# Patient Record
Sex: Male | Born: 1985 | Race: White | Hispanic: No | Marital: Married | Smoking: Never smoker
Health system: Southern US, Community
[De-identification: ages and names within clinical notes are randomized; demographics above are authoritative.]

---

## 2015-08-23 ENCOUNTER — Encounter (HOSPITAL_BASED_OUTPATIENT_CLINIC_OR_DEPARTMENT_OTHER): Payer: Self-pay | Admitting: *Deleted

## 2015-08-23 ENCOUNTER — Emergency Department (HOSPITAL_BASED_OUTPATIENT_CLINIC_OR_DEPARTMENT_OTHER): Payer: Self-pay

## 2015-08-23 ENCOUNTER — Emergency Department (HOSPITAL_BASED_OUTPATIENT_CLINIC_OR_DEPARTMENT_OTHER)
Admission: EM | Admit: 2015-08-23 | Discharge: 2015-08-23 | Disposition: A | Discharge status: Home / Self Care | Payer: Self-pay | Attending: Emergency Medicine | Admitting: Emergency Medicine

## 2015-08-23 DIAGNOSIS — R0981 Nasal congestion: Secondary | ICD-10-CM | POA: Insufficient documentation

## 2015-08-23 DIAGNOSIS — E669 Obesity, unspecified: Secondary | ICD-10-CM | POA: Insufficient documentation

## 2015-08-23 DIAGNOSIS — R1013 Epigastric pain: Secondary | ICD-10-CM | POA: Insufficient documentation

## 2015-08-23 DIAGNOSIS — R0982 Postnasal drip: Secondary | ICD-10-CM | POA: Insufficient documentation

## 2015-08-23 LAB — CBC WITH DIFFERENTIAL/PLATELET
BASOS PCT: 0 %
Basophils Absolute: 0 10*3/uL (ref 0.0–0.1)
Eosinophils Absolute: 0.8 10*3/uL — ABNORMAL HIGH (ref 0.0–0.7)
Eosinophils Relative: 8 %
HEMATOCRIT: 40.3 % (ref 39.0–52.0)
HEMOGLOBIN: 13.4 g/dL (ref 13.0–17.0)
LYMPHS ABS: 2.8 10*3/uL (ref 0.7–4.0)
LYMPHS PCT: 28 %
MCH: 27 pg (ref 26.0–34.0)
MCHC: 33.3 g/dL (ref 30.0–36.0)
MCV: 81.3 fL (ref 78.0–100.0)
MONO ABS: 1.1 10*3/uL — AB (ref 0.1–1.0)
MONOS PCT: 11 %
NEUTROS ABS: 5.3 10*3/uL (ref 1.7–7.7)
NEUTROS PCT: 53 %
Platelets: 332 10*3/uL (ref 150–400)
RBC: 4.96 MIL/uL (ref 4.22–5.81)
RDW: 14.8 % (ref 11.5–15.5)
WBC: 9.9 10*3/uL (ref 4.0–10.5)

## 2015-08-23 LAB — COMPREHENSIVE METABOLIC PANEL
ALBUMIN: 3.7 g/dL (ref 3.5–5.0)
ALK PHOS: 63 U/L (ref 38–126)
ALT: 91 U/L — ABNORMAL HIGH (ref 17–63)
ANION GAP: 5 (ref 5–15)
AST: 60 U/L — ABNORMAL HIGH (ref 15–41)
BILIRUBIN TOTAL: 0.4 mg/dL (ref 0.3–1.2)
BUN: 12 mg/dL (ref 6–20)
CALCIUM: 8.7 mg/dL — AB (ref 8.9–10.3)
CHLORIDE: 108 mmol/L (ref 101–111)
CO2: 25 mmol/L (ref 22–32)
Creatinine, Ser: 0.93 mg/dL (ref 0.61–1.24)
GFR calc Af Amer: >60 mL/min (ref >60)
GLUCOSE: 107 mg/dL — AB (ref 65–99)
Potassium: 3.7 mmol/L (ref 3.5–5.1)
Sodium: 138 mmol/L (ref 135–145)
Total Protein: 7.1 g/dL (ref 6.5–8.1)

## 2015-08-23 LAB — OCCULT BLOOD X 1 CARD TO LAB, STOOL: Fecal Occult Bld: NEGATIVE

## 2015-08-23 LAB — LIPASE, BLOOD: Lipase: 25 U/L (ref 11–51)

## 2015-08-23 MED ORDER — ONDANSETRON HCL 4 MG/2ML IJ SOLN
4.0000 mg | Freq: Once | INTRAMUSCULAR | Status: AC
  Administered 2015-08-23: 4 mg via INTRAVENOUS
  Filled 2015-08-23: qty 2

## 2015-08-23 MED ORDER — PANTOPRAZOLE SODIUM 20 MG PO TBEC: 20.0000 mg | DELAYED_RELEASE_TABLET | Freq: Two times a day (BID) | ORAL | Status: AC

## 2015-08-23 MED ORDER — HYDROMORPHONE HCL 1 MG/ML IJ SOLN
0.5000 mg | Freq: Once | INTRAMUSCULAR | Status: AC
Start: 2015-08-23 — End: 2015-08-23
  Administered 2015-08-23: 0.5 mg via INTRAVENOUS
  Filled 2015-08-23: qty 1

## 2015-08-23 NOTE — ED Notes (Signed)
Abdominal pain in his upper abdomen x 2 days. He has a feeling like he has eaten to much. Pain is constant.

## 2015-08-23 NOTE — Discharge Instructions (Signed)

## 2015-08-23 NOTE — ED Provider Notes (Addendum)
CSN: [id number]     Arrival date & time 08/23/15  1430 History  By signing my name below, I, Timothy Sharp, attest that this documentation has been prepared under the direction and in the presence of Timothy Grizzle, MD. Electronically Signed: Budd Sharp, ED Scribe. 08/23/2015. 4:40 PM.      Chief Complaint  Patient presents with  . Abdominal Pain   The history is provided by the patient. No language interpreter was used.   HPI Comments: Timothy Sharp is a 30 y.o. male who presents to the Emergency Department complaining of constant, aching, upper abdominal pain onset 2 days ago. Pt states "it feels as though I have eaten too much and it never goes away." He reports associated mild diarrhea, sinus drainage, and mild congestion. He notes exacerbation of the pain with movement and after eating. He states that after eating he feels increasingly flatulent and gassy, neither of which alleviated the pain. He notes the pain alleviated to a 1/10 while he was waiting in the lobby of the ED. He reports his grandfather is adopted and thus he only has a skewed FHx. To his knowledge, he does not have a FHx of cholelithiasis. He denies use of alcohol or tobacco products. Pt denies loss of appetite, fever, chills, and n/v.   History reviewed. No pertinent past medical history. History reviewed. No pertinent past surgical history. No family history on file. Social History  Substance Use Topics  . Smoking status: Never Smoker   . Smokeless tobacco: None  . Alcohol Use: No    Review of Systems  Constitutional: Negative for fever and chills.  HENT: Positive for congestion and postnasal drip.   Gastrointestinal: Positive for diarrhea. Negative for nausea and vomiting.  All other systems reviewed and are negative.   Allergies  Review of patient's allergies indicates no known allergies.  Home Medications   Prior to Admission medications   Not on File   BP 119/67 mmHg  Pulse 77  Temp(Src) 98.6  F (37 C) (Oral)  Resp 18  SpO2 95% Physical Exam  Constitutional: He is oriented to person, place, and time. He appears well-developed and well-nourished.  obese  HENT:  Head: Normocephalic and atraumatic.  Right Ear: External ear normal.  Left Ear: External ear normal.  Nose: Nose normal.  Mouth/Throat: Oropharynx is clear and moist.  Eyes: Conjunctivae and EOM are normal. Pupils are equal, round, and reactive to light.  Neck: Normal range of motion. Neck supple.  Cardiovascular: Normal rate, regular rhythm, normal heart sounds and intact distal pulses.   Pulmonary/Chest: Effort normal and breath sounds normal. No respiratory distress. He has no wheezes. He exhibits no tenderness.  Abdominal: Soft. Bowel sounds are normal. He exhibits no distension and no mass. There is tenderness. There is no guarding.    Mild ttp  Musculoskeletal: Normal range of motion.  Neurological: He is alert and oriented to person, place, and time. He has normal reflexes. He exhibits normal muscle tone. Coordination normal.  Skin: Skin is warm and dry.  Psychiatric: He has a normal mood and affect. His behavior is normal. Judgment and thought content normal.  Nursing note and vitals reviewed.   ED Course  Procedures  DIAGNOSTIC STUDIES: Oxygen Saturation is 100% on RA, normal by my interpretation.    COORDINATION OF CARE: 4:37 PM - Discussed possible gastritis. Discussed plans to wait on diagnostic studies and imaging. Pt advised of plan for treatment and pt agrees. Labs Review Labs Reviewed  CBC WITH  DIFFERENTIAL/PLATELET - Abnormal; Notable for the following:    Monocytes Absolute 1.1 (*)    Eosinophils Absolute 0.8 (*)    All other components within normal limits  COMPREHENSIVE METABOLIC PANEL - Abnormal; Notable for the following:    Glucose, Bld 107 (*)    Calcium 8.7 (*)    AST 60 (*)    ALT 91 (*)    All other components within normal limits  LIPASE, BLOOD  OCCULT BLOOD X 1 CARD TO  LAB, STOOL    Imaging Review [place] Abdomen Complete  08/23/2015  CLINICAL DATA:  Upper abdominal pain for 2 days. Abdominal fullness. EXAM: ABDOMEN ULTRASOUND COMPLETE COMPARISON:  None. FINDINGS: Gallbladder: No gallstones or wall thickening visualized. No sonographic Murphy sign noted by sonographer. Common bile duct: Diameter: 4 mm Liver: No focal lesion identified. Echogenic with poor sonic penetration favoring hepatic steatosis. IVC: No abnormality visualized. Pancreas: Visualized portion unremarkable. Pancreatic tail and portions the pancreatic head not well seen due to overlying bowel gas. Spleen: Size and appearance within normal limits. Right Kidney: Length: 11.7 cm. Echogenicity within normal limits. No mass or hydronephrosis visualized. Left Kidney: Length: 13.4 cm. Echogenicity within normal limits. No mass or hydronephrosis visualized. Abdominal aorta: No aneurysm visualized. Other findings: None. IMPRESSION: 1. Hepatic steatosis. Otherwise, no significant abnormalities are observed. 2. The pancreatic tail and portions the pancreatic head are not well seen due to overlying bowel gas. Electronically Signed   By: Timothy Sharp  Timothy Sharp M.D.   On: 08/23/2015 18:06   I have personally reviewed and evaluated these images and lab results as part of my medical decision-making.   EKG Interpretation None      MDM   Final diagnoses:  Epigastric pain  29 y.o. Male with epigastric pain worsened with food intake.  Patient nontoxic appearing and taking po without difficulty.  Plan treatment with gastric acid inhibitor.  Work up here normal with us significant for hepatic steatosis but no stones or cholecystitis seen.  Return precautions and need for follow-up discussed with patient and he voices understanding. I personally performed the services described in this documentation, which was scribed in my presence. The recorded information has been reviewed and considered.   Timothy Grizzleanielle Tyreanna Bisesi, MD 08/23/15  [id number]  Timothy Grizzleanielle Amrit Erck, MD 08/23/15 [id number]

## 2015-08-23 NOTE — ED Notes (Signed)
Patient need to void, gave urine sample, placing at bedside if needed.

## 2016-06-10 IMAGING — US US ABDOMEN COMPLETE
1 series · 14 of 25 positions shown · non-contrast
Comparison: None.

CLINICAL DATA: Upper abdominal pain for 2 days. Abdominal fullness.

EXAM:
ABDOMEN ULTRASOUND COMPLETE

[Series 1: us abdomen complete · 0.21mm/px · 14 of 76 slices shown]
[im 1/76]
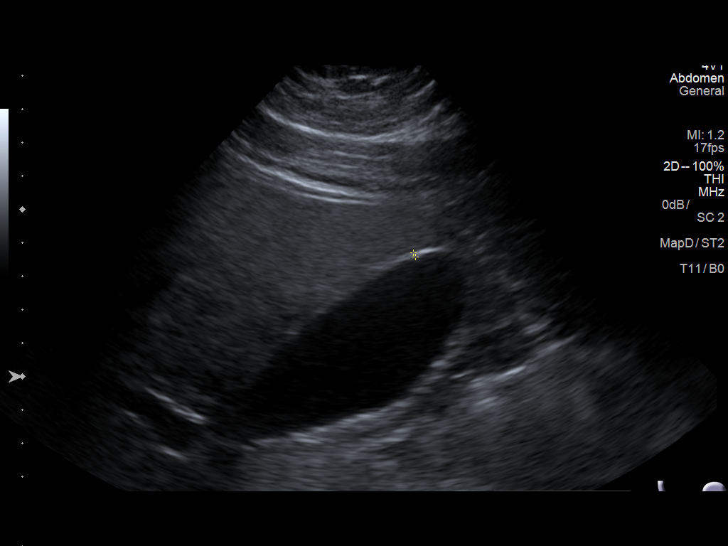
[im 7/76]
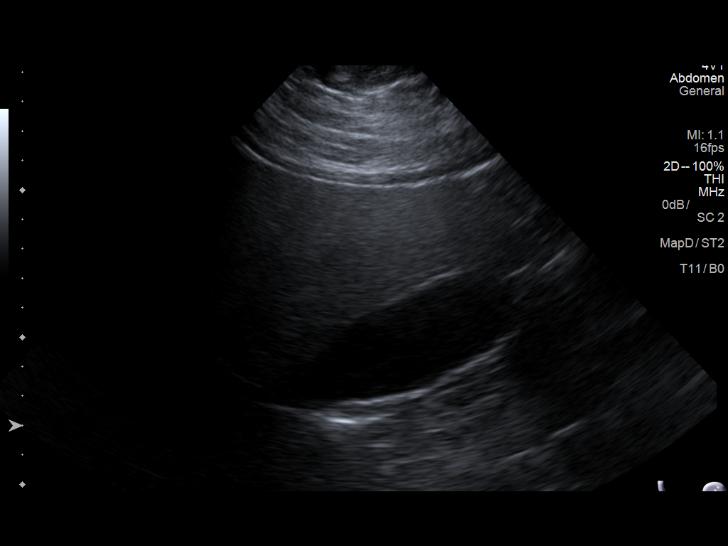
[im 13/76]
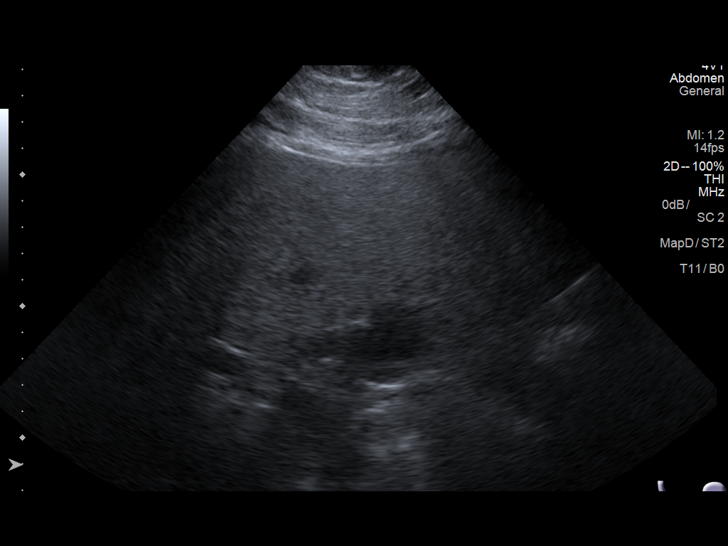
[im 19/76]
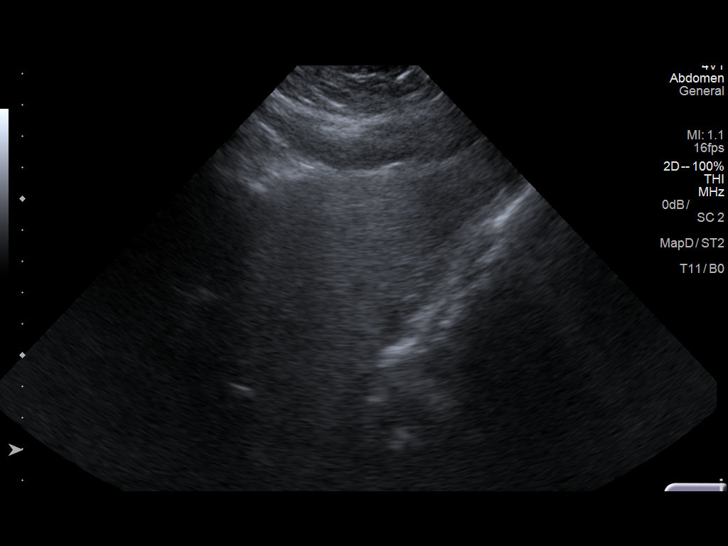
[im 26/76]
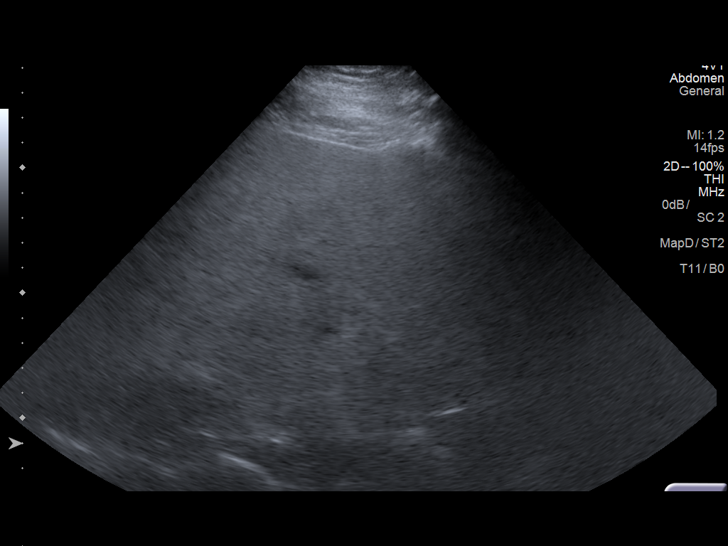
[im 29/76]
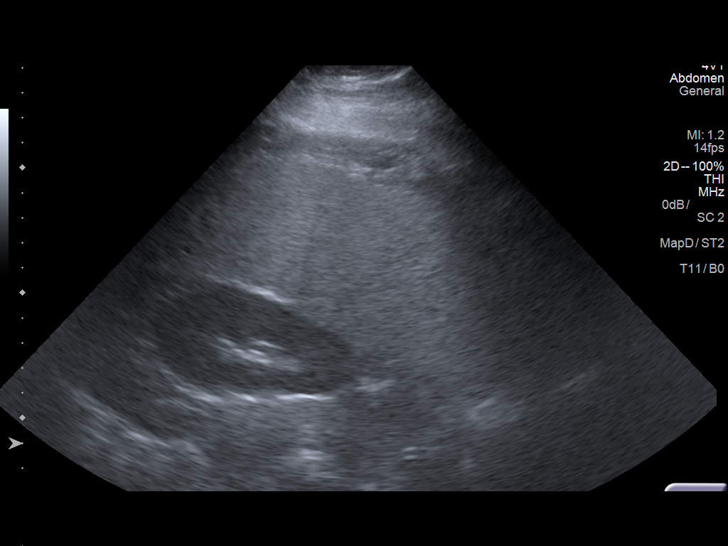
[im 35/76]
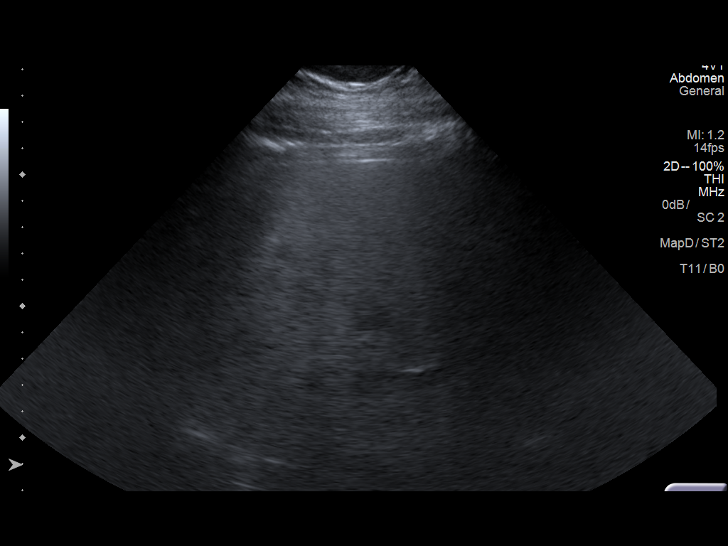
[im 41/76]
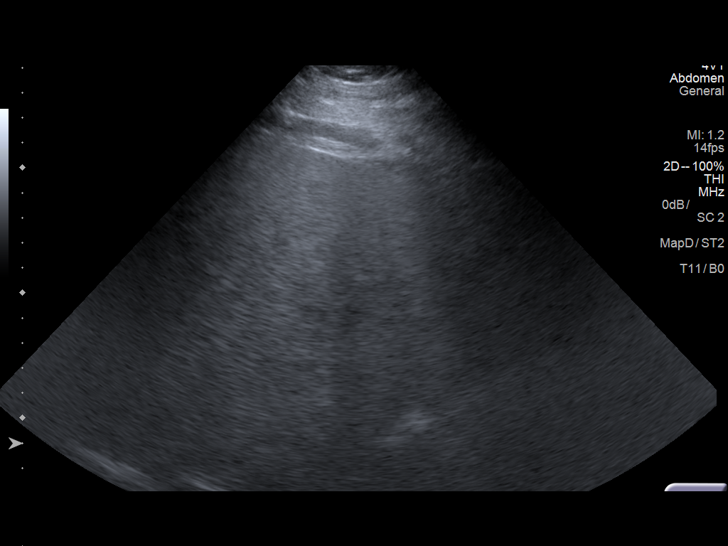
[im 47/76]
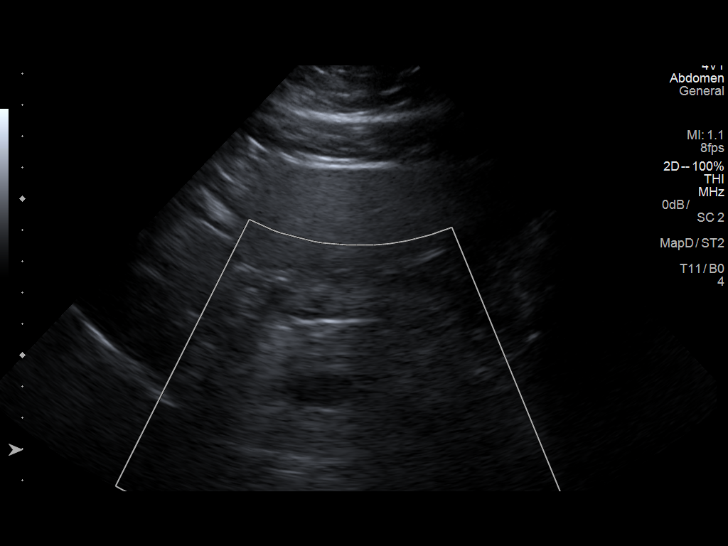
[im 51/76]
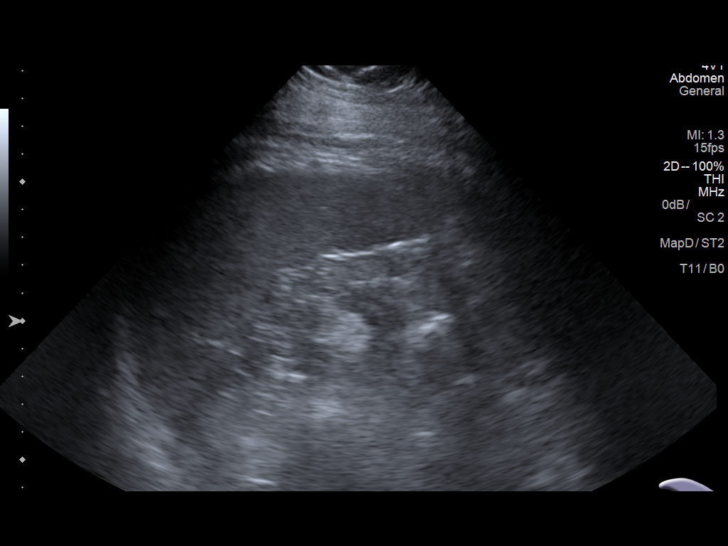
[im 57/76]
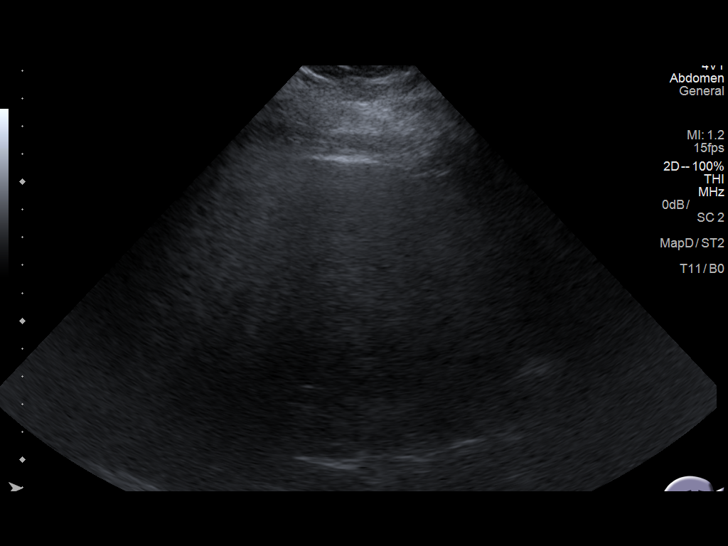
[im 63/76]
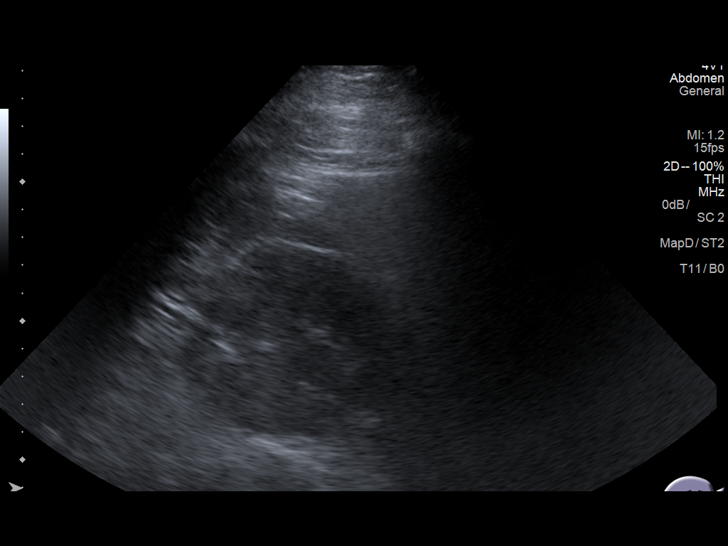
[im 69/76]
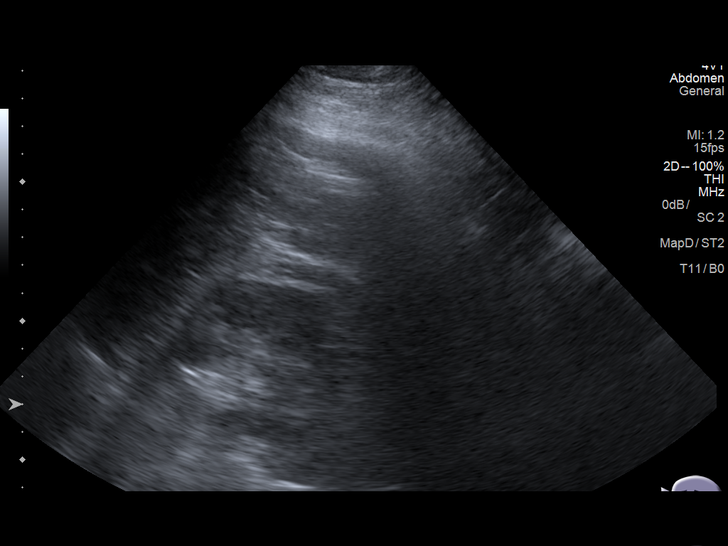
[im 76/76]
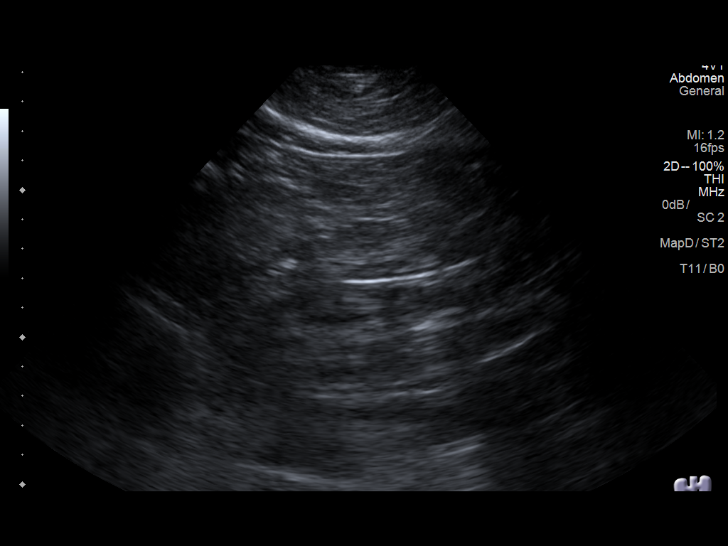

[14 of 25 positions shown; findings below may reference images not displayed]

FINDINGS: Gallbladder: No gallstones or wall thickening visualized. No
sonographic Murphy sign noted by sonographer.

Common bile duct: Diameter: 4 mm

Liver: No focal lesion identified. Echogenic with poor sonic
penetration favoring hepatic steatosis.

IVC: No abnormality visualized.

Pancreas: Visualized portion unremarkable. Pancreatic tail and
portions the pancreatic head not well seen due to overlying bowel
gas.

Spleen: Size and appearance within normal limits.

Right Kidney: Length: 11.7 cm. Echogenicity within normal limits. No
mass or hydronephrosis visualized.

Left Kidney: Length: 13.4 cm. Echogenicity within normal limits. No
mass or hydronephrosis visualized.

Abdominal aorta: No aneurysm visualized.

Other findings: None.
IMPRESSION: 1. Hepatic steatosis. Otherwise, no significant abnormalities are
observed.
2. The pancreatic tail and portions the pancreatic head are not well
seen due to overlying bowel gas.
